# Patient Record
Sex: Female | Born: 1975 | Race: White | Hispanic: No | Marital: Married | Smoking: Never smoker
Health system: Southern US, Community
[De-identification: ages and names within clinical notes are randomized; demographics above are authoritative.]

---

## 1998-03-11 ENCOUNTER — Other Ambulatory Visit: Admission: RE | Admit: 1998-03-11 | Discharge: 1998-03-11 | Payer: Self-pay | Admitting: *Deleted

## 1998-07-24 ENCOUNTER — Encounter: Admission: RE | Admit: 1998-07-24 | Discharge: 1998-07-24 | Payer: Self-pay | Admitting: Family Medicine

## 1999-08-03 ENCOUNTER — Other Ambulatory Visit: Admission: RE | Admit: 1999-08-03 | Discharge: 1999-08-03 | Payer: Self-pay | Admitting: *Deleted

## 2000-08-09 ENCOUNTER — Other Ambulatory Visit: Admission: RE | Admit: 2000-08-09 | Discharge: 2000-08-09 | Payer: Self-pay | Admitting: *Deleted

## 2001-08-30 ENCOUNTER — Other Ambulatory Visit: Admission: RE | Admit: 2001-08-30 | Discharge: 2001-08-30 | Payer: Self-pay | Admitting: Obstetrics and Gynecology

## 2002-10-17 ENCOUNTER — Other Ambulatory Visit: Admission: RE | Admit: 2002-10-17 | Discharge: 2002-10-17 | Payer: Self-pay | Admitting: Obstetrics & Gynecology

## 2003-04-26 ENCOUNTER — Inpatient Hospital Stay (HOSPITAL_COMMUNITY): Admission: AD | Admit: 2003-04-26 | Discharge: 2003-04-26 | Payer: Self-pay | Admitting: Obstetrics & Gynecology

## 2003-05-25 ENCOUNTER — Inpatient Hospital Stay (HOSPITAL_COMMUNITY): Admission: AD | Admit: 2003-05-25 | Discharge: 2003-05-28 | Payer: Self-pay | Admitting: Obstetrics and Gynecology

## 2003-07-01 ENCOUNTER — Other Ambulatory Visit: Admission: RE | Admit: 2003-07-01 | Discharge: 2003-07-01 | Payer: Self-pay | Admitting: Obstetrics & Gynecology

## 2004-12-02 ENCOUNTER — Inpatient Hospital Stay (HOSPITAL_COMMUNITY): Admission: AD | Admit: 2004-12-02 | Discharge: 2004-12-02 | Payer: Self-pay | Admitting: Obstetrics and Gynecology

## 2004-12-10 ENCOUNTER — Inpatient Hospital Stay (HOSPITAL_COMMUNITY): Admission: AD | Admit: 2004-12-10 | Discharge: 2004-12-13 | Payer: Self-pay | Admitting: Obstetrics & Gynecology

## 2004-12-11 ENCOUNTER — Encounter (INDEPENDENT_AMBULATORY_CARE_PROVIDER_SITE_OTHER): Payer: Self-pay | Admitting: *Deleted

## 2005-11-10 ENCOUNTER — Ambulatory Visit (HOSPITAL_COMMUNITY): Admission: RE | Admit: 2005-11-10 | Discharge: 2005-11-10 | Payer: Self-pay | Admitting: Obstetrics and Gynecology

## 2006-04-13 ENCOUNTER — Encounter (INDEPENDENT_AMBULATORY_CARE_PROVIDER_SITE_OTHER): Payer: Self-pay | Admitting: *Deleted

## 2006-04-13 ENCOUNTER — Inpatient Hospital Stay (HOSPITAL_COMMUNITY): Admission: AD | Admit: 2006-04-13 | Discharge: 2006-04-16 | Payer: Self-pay | Admitting: Obstetrics and Gynecology

## 2006-07-27 IMAGING — US US FETAL BPP W/O NONSTRESS
1 series · 13 of 21 positions shown · non-contrast
Comparison: none

CLINICAL DATA: 28-year-old.  40 weeks pregnant.  Evaluate movement and fluid.

[Series 1: us fetal bpp w/o nonstress · 0.33mm/px · 13 of 21 slices shown]
[im 1/21]
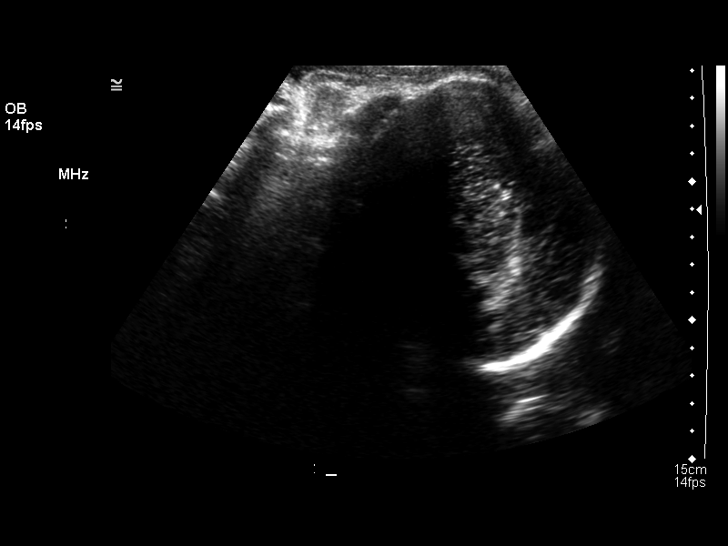
[im 3/21]
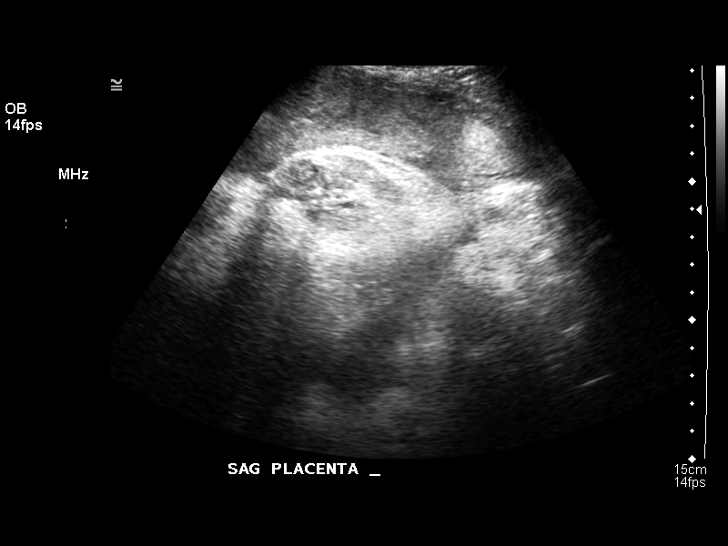
[im 5/21]
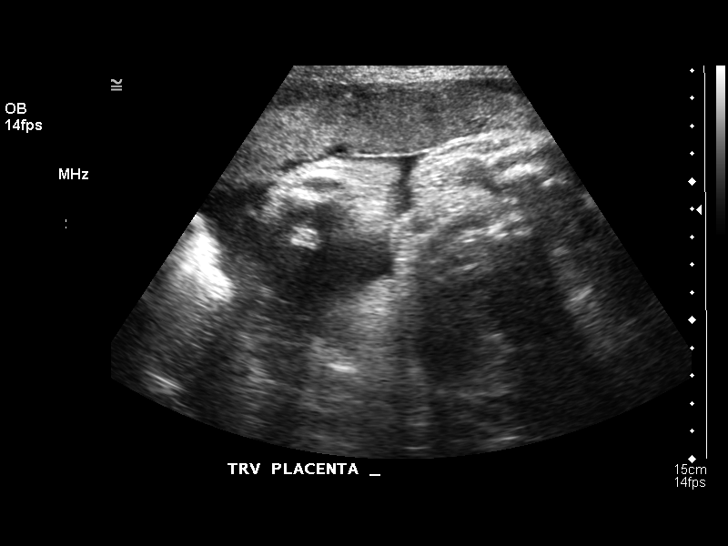
[im 6/21]
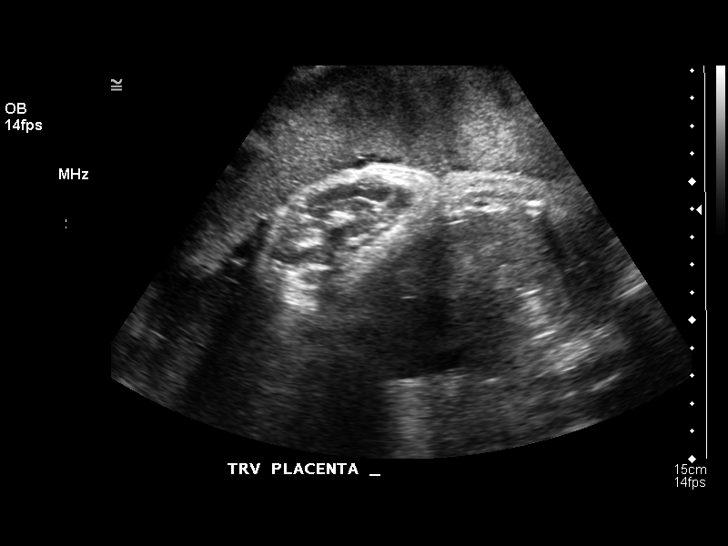
[im 8/21]
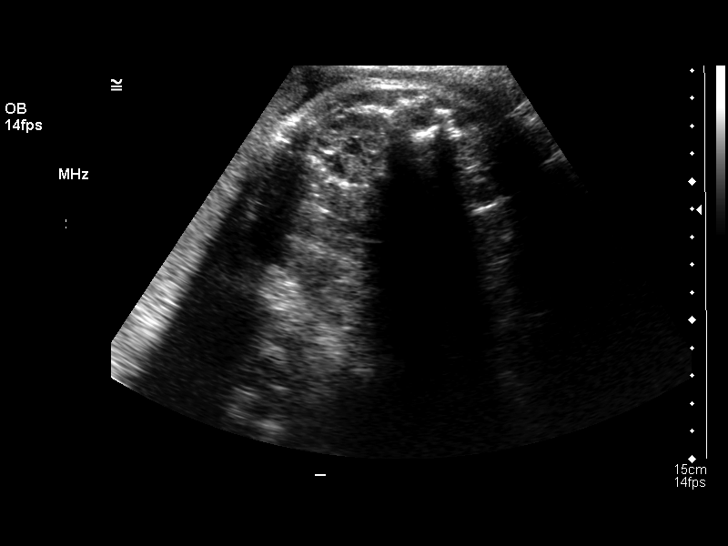
[im 9/21]
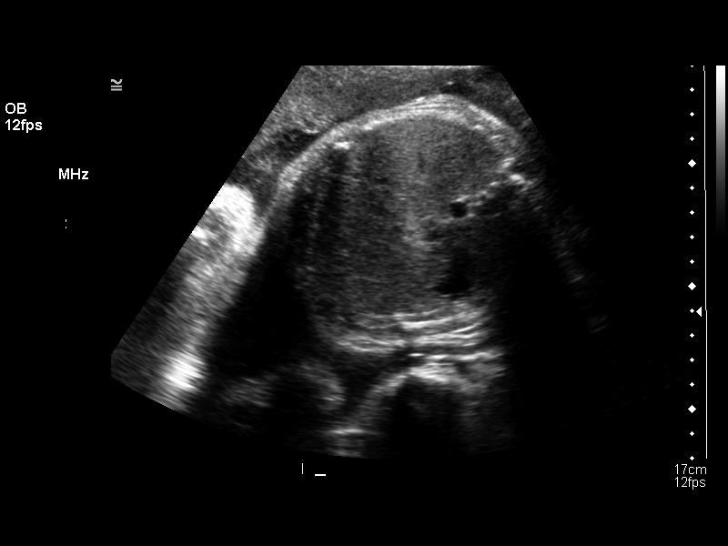
[im 11/21]
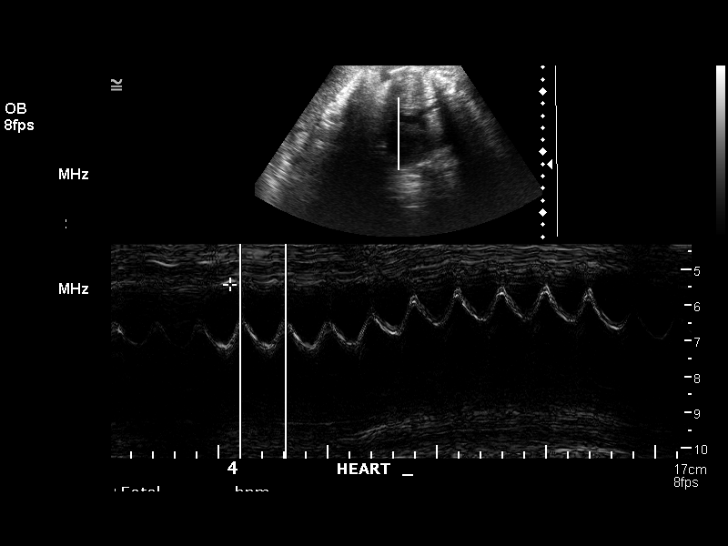
[im 13/21]
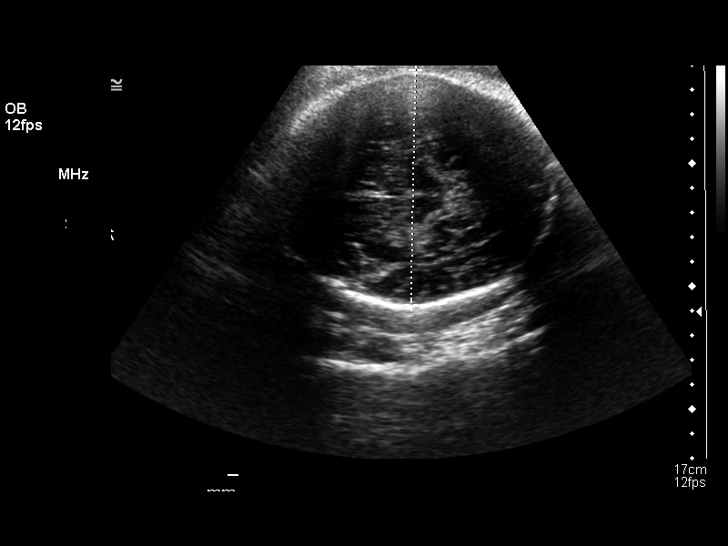
[im 14/21]
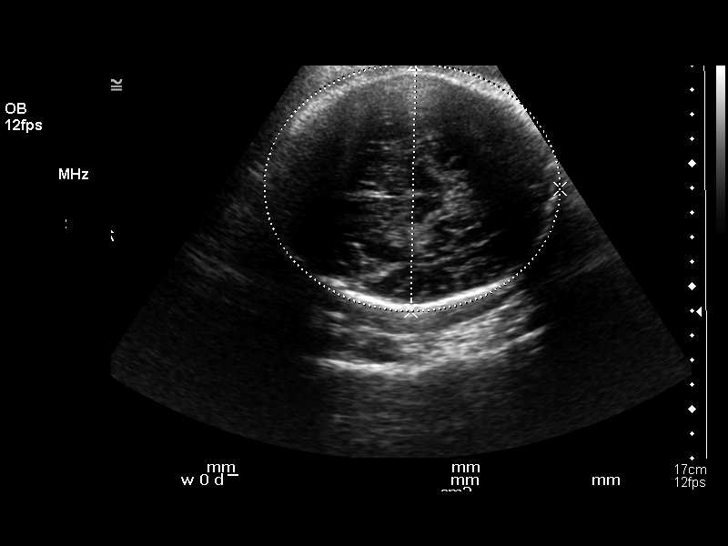
[im 16/21]
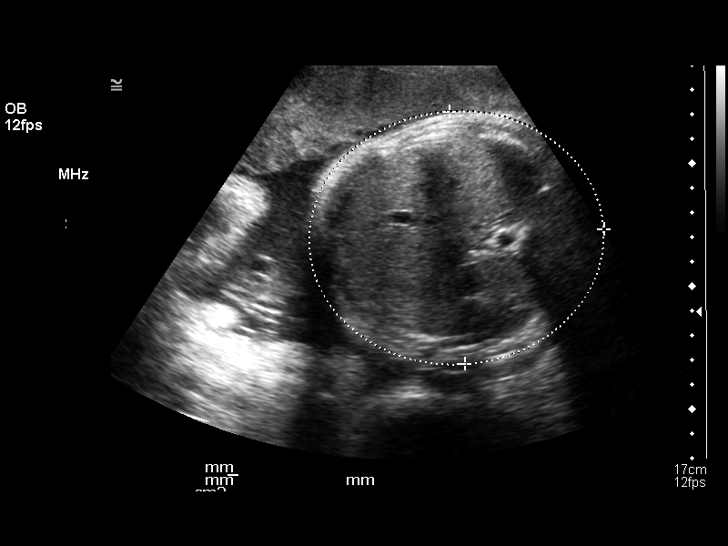
[im 17/21]
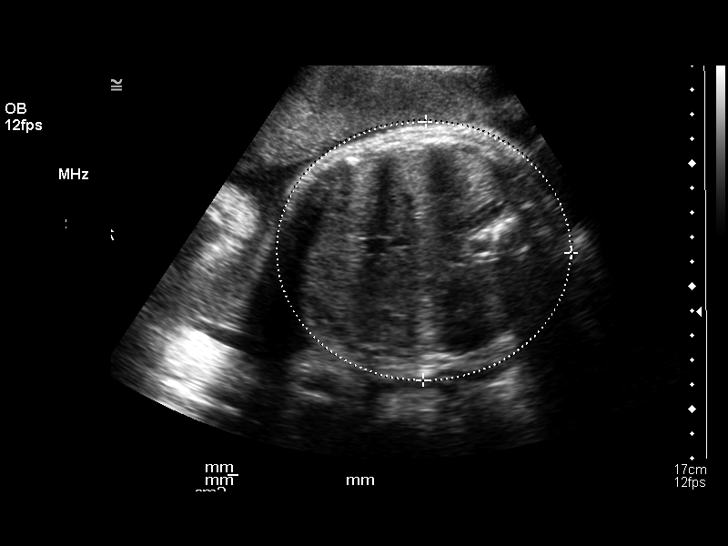
[im 19/21]
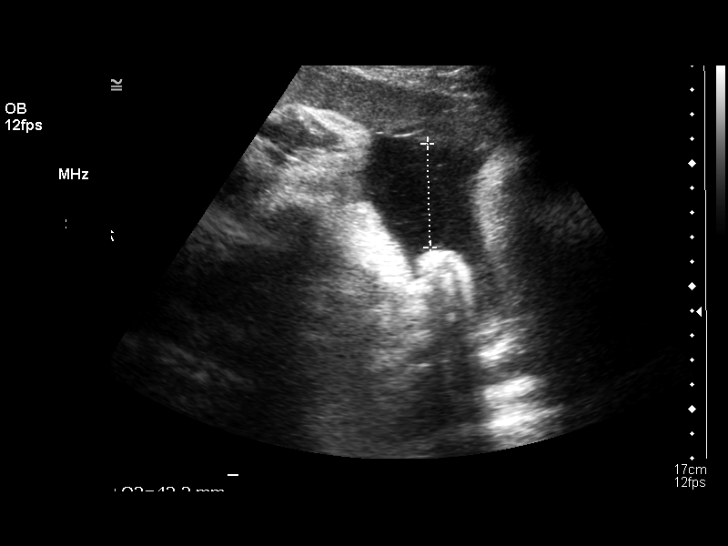
[im 21/21]
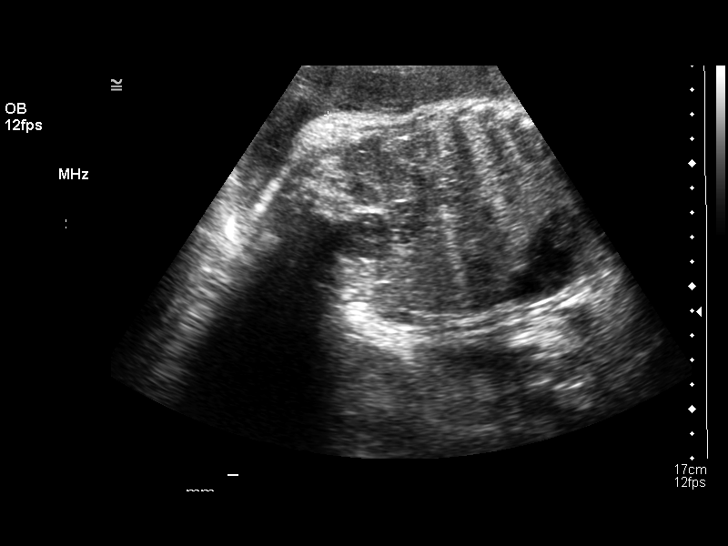

[13 of 21 positions shown; findings below may reference images not displayed]

OBSTETRICAL ULTRASOUND:
 Number of Fetuses:  1
 Heart Rate:  144
 Movement:  Yes
 Breathing:    Yes  
 Presentation:  Cephalic
 Placental Location:  Anterior
 Grade:  I
 Previa:  No
 Amniotic Fluid (Subjective):  Normal
 Amniotic Fluid (Objective):   11.8 cm AFI (5th -95th%ile = 7.1 ? 21.4 cm for 40 wks)

 FETAL BIOMETRY
 BPD:   9.6 cm   39 w 0 d
 HC:   34.3 cm  39 w 4 d
 AC:   35.1 cm   39 w 0 d
 FL:    7.0 cm   36 w 1 d

 MEAN GA:  38 w 3 d

 EFW:  3013 g (H) 25th ? 50th%ile (0011 ? 0767 g) For 40 wks

 FETAL ANATOMY
 Lateral Ventricles:    Visualized 
 Thalami/CSP:      Visualized 
 Posterior Fossa:  Not visualized 
 Nuchal Region:    N/A
 Spine:      Not visualized 
 4 Chamber Heart on Left:      Visualized 
 Stomach on Left:      Visualized 
 3 Vessel Cord:    Not visualized   
 Cord Insertion site:    Not visualized 
 Kidneys:  Visualized 
 Bladder:  Visualized 
 Extremities:      Not visualized 

 MATERNAL UTERINE AND ADNEXAL FINDINGS
 Cervix:   Not evaluated

 BIOPHYSICAL PROFILE

 Movement:  2    Time:  10 minutes
 Breathing:  2
 Tone:  2
 Amniotic Fluid:  2

 Total Score:  8
IMPRESSION: 1.  Single living intrauterine fetus in a cephalic presentation with normal amniotic fluid volume and an anterior grade 1 placenta.  Fetus is estimated at 39 weeks and 6 days gestation.
 2.  Biophysical profile score was [DATE] at 10 minutes.

## 2010-03-29 ENCOUNTER — Inpatient Hospital Stay (HOSPITAL_COMMUNITY): Admission: AD | Admit: 2010-03-29 | Discharge: 2010-03-31 | Payer: Self-pay | Admitting: Obstetrics and Gynecology

## 2010-10-08 LAB — CBC
MCH: 27.9 pg (ref 26.0–34.0)
MCHC: 33.3 g/dL (ref 30.0–36.0)
MCHC: 33.7 g/dL (ref 30.0–36.0)
MCV: 84.9 fL (ref 78.0–100.0)
Platelets: 223 10*3/uL (ref 150–400)
Platelets: 247 10*3/uL (ref 150–400)
RDW: 14.2 % (ref 11.5–15.5)
WBC: 11.8 10*3/uL — ABNORMAL HIGH (ref 4.0–10.5)

## 2010-10-08 LAB — RPR: RPR Ser Ql: NONREACTIVE

## 2010-12-11 NOTE — H&P (Signed)
NAMECARAL, WHAN NO.:  [id number]   MEDICAL RECORD NO.:  [id number]           PATIENT TYPE:   LOCATION:                                FACILITY:  [facility]   PHYSICIAN:  Richardean Sale, M.D.   DATE OF BIRTH:  [birth year]   DATE OF ADMISSION:  12/10/2004  DATE OF DISCHARGE:                                HISTORY & PHYSICAL   ADMITTING DIAGNOSIS:  Forty-one-week intrauterine pregnancy for induction of  labor.   HISTORY OF PRESENT ILLNESS:  This is a 35 year old gravida 2, para 1-0-0-1  white female with a due date of Dec 03, 2004 who presents today for  induction of labor secondary to postdates.  Prenatal care has been at  [facility] with Dr. Richardean Sale as the primary attending and  pregnancy has been uncomplicated.  Patient reports good fetal movement and  irregular contractions, denies any loss of fluid or vaginal bleeding.   PAST OBSTETRIC HISTORY:  Spontaneous vaginal delivery x1 at 40+ weeks of 7  pounds 7 ounces.   GYNECOLOGIC HISTORY:  No history of abnormal Pap smears.  Initial Chlamydia  screen positive this pregnancy, repeat negative after treatment.   PAST MEDICAL HISTORY:  No hospitalizations.   PAST SURGICAL HISTORY:  Wisdom teeth.   FAMILY HISTORY:  No known birth defects, congenital anomalies, Down  syndrome, spina bifida or cystic fibrosis.  Positive for prostate cancer in  patient's father and grandparents with emphysema and coronary artery  disease.   SOCIAL HISTORY:  She is married.  Denies tobacco, alcohol or drugs.   MEDICATIONS:  Prenatal vitamins.   ALLERGIES:  No known drug allergies.   PHYSICAL EXAMINATION:  VITAL SIGNS:  Blood pressure 112/74, weight 153  pounds, afebrile.  GENERAL:  She is a well-developed, well-nourished white female who is in no  acute distress.  NECK AND THYROID:  Within normal limits.  HEART:  Regular rate and rhythm.  LUNGS:  Clear to auscultation.  ABDOMEN:  Gravid, soft, nontender, fundal  height is 36.  EXTREMITIES:  Trace edema in the feet bilaterally.  No cyanosis or clubbing.  NEUROLOGIC EXAM:  Nonfocal.  CERVIX:  Cervix is 2-3 cm, 70%, -1 station, vertex.  Fetal heart tones 120s-  130s by Doppler.  Last ultrasound performed on Dec 02, 2004 revealed an  appropriately growing fetus measuring 3453 g on estimated fetal weight,  amniotic fluid index was normal at 11.8 cm.   PRENATAL LABORATORIES:  Blood type is A positive, antibody screen negative,  RPR nonreactive, rubella immune, hepatitis B surface antigen nonreactive,  HIV was declined.  Initial Chlamydia screen was positive, repeat was  negative after treatment.  Gonorrhea screen negative.  Pap smear within  normal limits.  Triple test within normal limits and group B beta strep was  negative.  One-hour glucola was 120.   ASSESSMENT:  [age]-year-old gravida 2, para 1-0-0-1 white female at [redacted]  weeks gestation for induction of labor secondary to postdates.   PLAN:  1.  We will admit to labor and delivery.  2.  Continuous fetal monitoring.  3.  We will start load of Pitocin and perform amniotomy.  4.  Anticipate attempts at vaginal delivery.      JW/MEDQ  D:  12/09/2004  T:  12/09/2004  Job:  [id number]

## 2010-12-11 NOTE — Discharge Summary (Signed)
NAMEJAYLINE, Mariah Andrade                  ACCOUNT NO.:  [id number]   MEDICAL RECORD NO.:  [id number]          PATIENT TYPE:  INP   LOCATION:  9142                          FACILITY:  [facility]   PHYSICIAN:  Lenoard Aden, M.D.DATE OF BIRTH:  10-06-1975   DATE OF ADMISSION:  04/13/2006  DATE OF DISCHARGE:  04/16/2006                                 DISCHARGE SUMMARY   The patient underwent an emergent C section April 13, 2006.  Postoperative course uncomplicated. Discharged to home on day 3. Discharge  teaching done. Prenatal vitamins, iron and Tylox given. Followup in the  office in 4-6 weeks.      Lenoard Aden, M.D.  Electronically Signed     RJT/MEDQ  D:  05/07/2006  T:  05/09/2006  Job:  [id number]

## 2010-12-11 NOTE — H&P (Signed)
NAME:  ABBEY, VEITH                            ACCOUNT NO.:  [id number]   MEDICAL RECORD NO.:  [id number]                   PATIENT TYPE:  INP   LOCATION:  9128                                 [facility]ACILITY:  [facility]   PHYSICIAN:  Richardean Sale, M.D.                DATE OF BIRTH:  1976/03/08   DATE OF ADMISSION:  05/25/2003  DATE OF DISCHARGE:                                HISTORY & PHYSICAL   ADMISSION DIAGNOSES:  A 35 year old gravida 1, para 0 white female at 40  weeks and 3 days gestation, in labor.   HISTORY OF PRESENT ILLNESS:  This is a 35 year old gravida 1, para 0 white  female with a due date of May 23, 2003, who presents at 64 and [redacted] weeks  gestation with complaints of contractions every 5 minutes that are moderate  in intensity. The patient denies any vaginal bleeding, any loss of fluid and  reports good fetal movement. The pregnancy has been uncomplicated. Last  cervical examination, she was 3 cm, 80% effaced and a minus 1 station. On  examination here, she is 4 to 5 cm,  90% effaced, 0 station, vertex with a  bag of water intact.   PAST GYNECOLOGIC HISTORY:  Menarche at age 56. Menses 28 days, lasting 2 to  4 days in length. No history of STD's. No history of abnormal pap smears.   PAST MEDICAL HISTORY:  None.   PAST SURGICAL HISTORY:  None.   SOCIAL HISTORY:  No tobacco, alcohol or drugs.   FAMILY HISTORY:  No known history of inherited genetic diseases, birth  defects, mental retardation or bleeding disorders.   MEDICATIONS:  Prenatal vitamins.   ALLERGIES:  None.   LABORATORY DATA:  Hemoglobin 13.1, hematocrit 38.1. Blood type A+. Antibody  screen negative. RPR non-reactive. Rubella immune. Hepatitis B surface  antigen negative. HIV non-reactive. Alpha fetoprotein triple test within  normal limits. Group B beta strep status unknown at this time. Per patient,  culture reported to be done at 35 weeks. 1 hour glucose tolerance test  within normal limits per the  patient's report but exact value not available  at this time.   PHYSICAL EXAMINATION:  VITAL SIGNS:  Blood pressure 115/79, heart rate 69,  temperature 98.2. Fetal heart rate tracing is in the 120's with positive  accelerations. Tocometer contractions every 5 minutes.  GENERAL:  A well developed, well nourished white female who appears in no  acute distress.  HEART:  Regular rate and rhythm.  CHEST:  Lungs are clear to auscultation bilaterally.  ABDOMEN:  Gravid and non-tender. Fundus palpates moderate with contractions.  PELVIC:  Cervix 4 to 5 cm, 90% effaced, 0 station, and vertex.  EXTREMITIES:  No edema. Non-tender.   ASSESSMENT:  A 35 year old gravida 1, para 0 white female at 40 weeks and 3  days gestation, in labor.   PLAN:  1. Will admit  to labor and delivery.  2. Check CPD, RPR, type and screen.  3. Will obtain group B beta strep culture results. If positive will treat     with Penicillin.  4. Will check cervix in 1 hour. If no cervical change, will perform     amniotomy.  5. Stadol or epidural p.r.n.  6. Expect SPD.  7. Fetal heart rate tracings reassuring.                                               Richardean Sale, M.D.    JW/MEDQ  D:  05/25/2003  T:  05/26/2003  Job:  [id number]

## 2010-12-11 NOTE — Op Note (Signed)
NAME:  Mariah Andrade, Mariah Andrade                            ACCOUNT NO.:  [id number]   MEDICAL RECORD NO.:  [id number]                   PATIENT TYPE:  INP   LOCATION:  9128                                 FACILITY:  [facility]   PHYSICIAN:  Richardean Sale, M.D.                DATE OF BIRTH:  05-Jun-1976   DATE OF PROCEDURE:  05/26/2003  DATE OF DISCHARGE:                                 OPERATIVE REPORT   PREOPERATIVE DIAGNOSIS:  Term intrauterine pregnancy in second stage of  labor with fetal bradycardia.   POSTOPERATIVE DIAGNOSIS:  Term intrauterine pregnancy in second stage of  labor with fetal bradycardia.   PROCEDURES:  Outlet vacuum-assisted delivery.   ANESTHESIA:  Epidural.   COMPLICATIONS:  None.   ESTIMATED BLOOD LOSS:  300 mL.   FINDINGS:  A viable female infant in left occiput anterior presentation with  Apgars of 8 and 9.  Intact placenta with three-vessel cord.   INDICATIONS:  This is a 35 year old, gravida 1, para 0, white female who  presented at 40-3/[redacted] weeks gestation in spontaneous labor.  The patient  progressed adequately in labor and reached complete dilatation at a +2  station at 6 a.m.  The heart rate tracing had been reassuring with baseline  in the 10s with accelerations noted.  She had one spontaneous deceleration  upon placement of the Foley catheter earlier in the course of her labor, but  there were no further decelerations noted up until the time she reached  complete dilation.  At that point, she began to have variable decelerations  down to the 80s.  There was good fetal scalp stimulation throughout her  second stage, which lasted only 30 minutes.  The patient was counseled on  the need for possible vacuum-assisted delivery for fetal bradycardia.  The  risks of the procedure were reviewed with the patient and informed consent  was obtained.  The heart rate tracing then returned to baseline with good  accelerations.  The infant was left occiput transverse and was  manually  rotated to left occiput anterior without any difficulty.  There was good  scalp stimulation noted during the rotation.  The variable decelerations  recurred with fetal bradycardia in the 60s.  Therefore, the kiwi vacuum was  used to facilitate delivery secondary to fetal bradycardia.   DESCRIPTION OF PROCEDURE:  The patient was prepped and draped in the  dorsolithotomy position.  The bladder was drained with a red rubber  catheter.  The fetal position was changed.  The infant was left occiput  anterior.  The kiwi vacuum was then placed over the flexion point 3 cm in  front of the posterior fontanel over the sagittal suture.  Vacuum was then  applied to 500 mmHg and the infant was delivered to crowning with the next  contraction.  The vacuum was then removed.  The vertex was delivered.  The  infant  was bulb suctioned on the perineum.  The shoulders were then  delivered within 10 seconds without difficulty and the infant was delivered  to the mother's abdomen with a vigorous cry.  The cord was clamped and cut.  Arterial cord gas was sent.  Cord blood was collected and the placenta was  then delivered spontaneously and intact with a three-vessel cord.  The  uterus was then explored manually.  There was no retained placenta.  The  perineum was inspected and there was a spontaneous partial third degree  midline laceration.  This was repaired with 3-0 Vicryl in a standard  fashion.  A rectal exam was performed which revealed no palpable sutures in  the rectum and the sphincter was intact.  The cervix was inspected and was  intact.  There were no vault lacerations noted.  Anesthesia was by epidural.  The estimated blood loss was 300 mL.  Apgars were 8 and 9.  The arterial  cord gas was 7.34.   The patient tolerated the procedure well.  All sponge, lap, needle, and  instrument counts were correct x 2.  She and the infant are doing well at  the time of this dictation.                                                Richardean Sale, M.D.    JW/MEDQ  D:  05/26/2003  T:  05/26/2003  Job:  [id number]

## 2010-12-11 NOTE — Op Note (Signed)
Mariah Andrade, Mariah Andrade                  ACCOUNT NO.:  [id number]   MEDICAL RECORD NO.:  [id number]          PATIENT TYPE:  INP   LOCATION:  9142                          FACILITY:  [facility]   PHYSICIAN:  Lenoard Aden, M.D.DATE OF BIRTH:  10/30/1975   DATE OF PROCEDURE:  04/13/2006  DATE OF DISCHARGE:                                 OPERATIVE REPORT   PREOPERATIVE DIAGNOSIS:  Nonreassuring fetal heart rate.   POSTOPERATIVE DIAGNOSIS:  Placental abruption.   OPERATION PERFORMED:  Emergent low segment transverse cesarean section.   SURGEON:  Lenoard Aden, M.D.   ASSISTANT:  Richardean Sale, M.D.   ANESTHESIA:  Spinal by Quillian Quince, M.D.   ESTIMATED BLOOD LOSS:  1500 mL.   COMPLICATIONS:  None.   DRAINS:  Foley catheter.   COUNTS:  Correct.   Patient to recovery in good condition.   DESCRIPTION OF PROCEDURE:  After being apprised of the risks of anesthesia,  infection and bleeding, the patient was brought urgently to the operating  room where she was administered spinal anesthetic without complication,  prepped and draped in the usual sterile fashion.  Foley catheter was placed.  Pfannenstiel skin incision made with a scalpel, carried down to fascia which  was opened bluntly.  Peritoneum entered bluntly.  Bladder blade placed.  Visceral peritoneum is scored in a smiley fashion, dissected sharply over  the lower uterine segment.  Curved hysterotomy incision made.  Clear  amniotic fluid.  Atraumatic delivery full term living female.  Pediatricians  in attendance.  Apgars 6 and 9, cord pH 7.02, large concealed placental  abruption is noted upon delivery of the placenta which probably about a 70  to 80% is noted.  Uterus exteriorized, curetted using a dry lap pack and  closed in two running imbricating layers using a 0 Monocryl suture.  An  O'Leary stitch is placed at the left lateral margin to obtain hemostasis.  No hematomas were formed.  Bladder flap was inspected and  found to be  hemostatic.  Irrigation accomplished.  All blood clots removed.  Peritoneum  closed in a running fashion using 2-0 Vicryl in continuous  running fashion.  Skin closed using staples after closure of the fascia with  a 0 Monocryl in continuous running fashion.  Then the skin was closed with  staples.  The patient tolerated the procedure well and was transferred to  recovery in stable condition.      Lenoard Aden, M.D.  Electronically Signed     RJT/MEDQ  D:  04/13/2006  T:  04/14/2006  Job:  [id number]

## 2018-03-13 ENCOUNTER — Other Ambulatory Visit: Payer: Self-pay | Admitting: Obstetrics

## 2018-03-13 DIAGNOSIS — N6320 Unspecified lump in the left breast, unspecified quadrant: Secondary | ICD-10-CM

## 2018-03-17 ENCOUNTER — Ambulatory Visit
Admission: RE | Admit: 2018-03-17 | Discharge: 2018-03-17 | Disposition: A | Discharge status: Home / Self Care | Payer: BC Managed Care – PPO | Source: Ambulatory Visit | Attending: Obstetrics | Admitting: Obstetrics

## 2018-03-17 DIAGNOSIS — N6320 Unspecified lump in the left breast, unspecified quadrant: Secondary | ICD-10-CM

## 2019-07-10 ENCOUNTER — Telehealth: Payer: Self-pay | Admitting: Oncology

## 2019-07-10 NOTE — Telephone Encounter (Signed)
A new high risk appt has been scheduled for [name] to see Dr. Jana Hakim on 1/11 at 4pm. Pt has been cld and made aware to arrive 15 minutes early.

## 2019-08-05 ENCOUNTER — Encounter: Payer: Self-pay | Admitting: Oncology

## 2019-08-05 NOTE — Progress Notes (Signed)
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 Her mother was diagnosed with ovarian cancer at age 25. Per patient, the cancer began in her mother's fallopian tubes. Her father has a history of prostate cancer. She underwent [facility] genetic testing on 06/14/2019 as detailed below. 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  The patient's subsequent history is as detailed below.   INTERVAL HISTORY: Mariah Andrade was evaluated in the high risk cancer clinic on 08/06/2019.   REVIEW OF SYSTEMS: Mariah Andrade denies unusual headaches, visual changes, nausea, vomiting, stiff neck, dizziness, or gait imbalance. There has been no cough, phlegm production, or pleurisy, no chest pain or pressure, and no change in bowel or bladder habits. The patient denies fever, rash, bleeding, unexplained fatigue or unexplained weight loss. A detailed review of systems was otherwise entirely negative.   PAST MEDICAL HISTORY: No past medical history on file.  PAST SURGICAL HISTORY: Past Surgical History:  Procedure Laterality Date  . CESAREAN SECTION      FAMILY HISTORY: Family History  Problem Relation Age of Onset  . Ovarian cancer Mother   . Prostate cancer Father    The patient's father is 65 years old as of January 2021.  He developed prostate cancer in his 28s.  There is no other history of cancer in his side of the family that the patient is aware of.  The patient's mother is 45 years old as of January 2021.  She was diagnosed with ovarian cancer at age 43.  Her mother died from  Parkinson's disease and her father from complications of tobacco use.  1 sister, the patient's maternal aunt had a hysterectomy at a young age but the reason for that surgery is not known.  There is no other cancer history in the family to the patient's knowledge  GYNECOLOGIC HISTORY:  No LMP recorded. Menarche: 44 years old Age at first live birth: 44 years old [place] P 4 LMP 2011 (IUD placed) Contraceptive: IUD in place [place], replaced 06/04/2015) HRT n/a  Hysterectomy? no BSO? 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  Her husband Mariah Andrade is a Government social research officer in his own Engineer, materials) business she lives at home with Mariah Andrade, Mariah Andrade, and Mariah Andrade ages 16, 14, 13, and 52 as of January 2021.  The patient is a Psychologist, forensic    ADVANCED DIRECTIVES: In the absence of any documentation to the contrary, the patient's spouse is their HCPOA.    HEALTH MAINTENANCE: Social History   Tobacco Use  . Smoking status: Never Smoker  Substance Use Topics  . Alcohol use: Not on file  . Drug use: Not on file     Colonoscopy: n/a (age)  PAP: 02/2018, negative  Bone density:    Not on File  Current Outpatient Medications  Medication Sig Dispense Refill  . tamoxifen (NOLVADEX) 20 MG tablet Take 1 tablet (20 mg total) by mouth daily. 90 tablet 12   No current facility-administered medications for this visit.    OBJECTIVE: Young white woman in no acute distress  Vitals:   08/06/19 1555  BP: (!) 123/52  Pulse: 79  Resp: 18  Temp: 98.6 F (37 C)  SpO2: 100%     There is no height or weight on file to calculate BMI.   Wt Readings from Last 3 Encounters:  08/06/19 131 lb 4.8 oz (59.6 kg)      ECOG FS:0 - Asymptomatic  Ocular: Sclerae unicteric, pupils round and equal Ear-nose-throat: Wearing a mask Lymphatic: No cervical or supraclavicular adenopathy Lungs no rales or rhonchi Heart regular rate and rhythm Abd soft,  nontender, positive bowel sounds MSK no focal spinal tenderness, no joint edema Neuro: non-focal, well-oriented, appropriate affect Breasts: Normally lumpy for age.  No suspicious findings.  Both axillae are benign.   LAB RESULTS:  CMP  No results found for: NA, K, CL, CO2, GLUCOSE, BUN, CREATININE, CALCIUM, PROT, ALBUMIN, AST, ALT, ALKPHOS, BILITOT, GFRNONAA, GFRAA  No results found for: TOTALPROTELP, ALBUMINELP, A1GS, A2GS, BETS, BETA2SER, GAMS, MSPIKE, SPEI  Lab Results  Component Value Date   WBC 11.8 (H) 03/30/2010   HGB 7.8 DELTA CHECK NOTED REPEATED TO VERIFY (L) 03/30/2010   HCT 23.0 (L) 03/30/2010   MCV 84.9 03/30/2010   PLT 223 03/30/2010    No results found for: LABCA2  No components found for: EHUDJS970  No results for input(s): INR in the last 168 hours.  No results found for: LABCA2  No results found for: YOV785  No results found for: YIF027  No results found for: XAJ287  No results found for: CA2729  No components found for: HGQUANT  No results found for: CEA1 / No results found for: CEA1   No results found for: AFPTUMOR  No results found for: CHROMOGRNA  No results found for: KPAFRELGTCHN, LAMBDASER, KAPLAMBRATIO (kappa/lambda light chains)  No results found for: HGBA, HGBA2QUANT, HGBFQUANT, HGBSQUAN (Hemoglobinopathy evaluation)   No results found for: LDH  No results found for: IRON, TIBC, IRONPCTSAT (Iron and TIBC)  No results found for: FERRITIN  Urinalysis No results found for: COLORURINE, APPEARANCEUR, LABSPEC, PHURINE, GLUCOSEU, HGBUR, BILIRUBINUR, KETONESUR, PROTEINUR, UROBILINOGEN, NITRITE, LEUKOCYTESUR   STUDIES: Mammography 03/17/2018 describes the breast density is category C.  Mammography 06/14/2019 describes it as D  ELIGIBLE FOR AVAILABLE RESEARCH PROTOCOL: no  ASSESSMENT: 44 y.o. 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  The RAD51c Romie Minus is involved in homologous recombination and repair of DNA.  It can be associated with Fanconi anemia-like syndrome.  It localizes to chromosome 17 Q 23 and amplification frequently occurs in breast tumors.  It is associated with increased risk of ovarian cancer.  A recent article [Journal of Ovarian Research volume 13, Article number: 50 (2020)] estimates and odds ratio 4.94 meaning that a patient  with this gene would have 5 times the normal risk of developing an ovarian malignancy.  There appears to be some increased risk of breast cancer as well but this cannot be quantitated with her current data.  [place] understands that everyone has mutations in some genes although we are not at the point where we check all 23,000 genes routinely.  The advantage of actually knowing one has a mutation is that that information allows one to make choices.  We discussed the risk of ovarian cancer and the likely somewhat increased risk of breast cancer in detail as well.  Since the lifetime risk of developing ovarian cancer in the general population is a little under 2%, we used a 10% risk of developing ovarian cancer in the setting for the purposes of discussion.  This does not sound  so bad and it really is not, but the problem is that we have no way of effectively screening for ovarian cancer.  By the time we find it it likely will be stage III.  Adda has a good understanding of this is this is exactly what her mother is going through.  Accordingly I would favor bilateral salpingo-oophorectomy with or without hysterectomy (she understands that there is a small amount of fallopian tube embedded in the uterus which if not removed would leave a small residual risk).  The question is when to do this since there are definite disadvantages to early menopause and we discussed that.  After much discussion it seemed best to wait a little and plan on definitive surgery for ovarian cancer prevention no later than age 55.  We then discussed the breast cancer risk issue and risk reduction strategies.  We considered intensified screening but I cannot show definitively that she has a greater than 20% lifetime breast cancer risk.  We could do a form of intensified screening with fast MRI instead of routine MRI but even that would represent approximately $500 every year.  A second risk reduction strategy is antiestrogens.  In her case this would mean tamoxifen and today we discussed the possible toxicities side effects and complications of this agent including concerns regarding endometrial carcinoma and blood clots.  I find that premenopausal patients who are on a levonorgestrel IUD do remarkably well on tamoxifen, with few if any side effects.  In more than 25 years in oncology I have only had one premenopausal patient on tamoxifen develop cancer of the uterus and that patient had a very strong family history for multiple cancers.  Note also that Ailana took oral contraceptives for many years with no clotting complications  At this point what she would like to do is give tamoxifen a try.  If she can tolerated well without significant complications the plan would be to continue that for total of 5 years  and not proceed to intensified screening.  Incidentally I would expect that her breasts, whether density C or D (the density has been variously classified), will be less dense at the end of 5 years of antiestrogens  I have gone ahead and made her a return appointment here for April but if everything is going just perfect at that time she will cancel the appointment and reschedule for a year from now  [place] has a good understanding of the overall plan. She agrees with it. She knows the goal of treatment in her case is prevention. She will call with any problems that may develop before her next visit here.  Total encounter time 60 minutes*  Chauncey Cruel, MD   08/06/2019 5:20 PM Medical Oncology and Hematology [facility] [place], [place] [address] Tel. [phone]    Fax. [phone]   This document serves as a record of services personally performed by Lurline Del, MD. It was created on his behalf by Wilburn Mylar, a trained medical scribe. The creation of this record is based on the scribe's personal observations and the provider's statements to them.   I, Lurline Del MD, have reviewed the above documentation for accuracy and completeness, and I agree with the above.  *Total Encounter Time as defined by the Centers for Medicare and Medicaid Services includes, in addition to the face-to-face time of a patient visit (documented in the note above) non-face-to-face time: obtaining and reviewing outside history, ordering and reviewing medications, tests or procedures, care coordination (communications with other health care professionals or caregivers) and documentation in the medical record.

## 2019-08-06 ENCOUNTER — Other Ambulatory Visit: Payer: Self-pay

## 2019-08-06 ENCOUNTER — Inpatient Hospital Stay: Payer: BC Managed Care – PPO | Attending: Oncology | Admitting: Oncology

## 2019-08-06 DIAGNOSIS — Z9189 Other specified personal risk factors, not elsewhere classified: Secondary | ICD-10-CM | POA: Insufficient documentation

## 2019-08-06 DIAGNOSIS — Z8041 Family history of malignant neoplasm of ovary: Secondary | ICD-10-CM | POA: Insufficient documentation

## 2019-08-06 MED ORDER — TAMOXIFEN CITRATE 20 MG PO TABS: 20.0000 mg | ORAL_TABLET | Freq: Every day | ORAL | 12 refills | Status: AC

## 2019-08-07 ENCOUNTER — Telehealth: Payer: Self-pay | Admitting: Oncology

## 2019-08-07 NOTE — Telephone Encounter (Signed)
I left a message regarding schedule  

## 2019-11-09 IMAGING — [facility] DIGITAL DIAGNOSTIC UNILATERAL LEFT MAMMOGRAM WITH TOMO AND CAD
4 series · 4 of 12 positions shown · non-contrast
Comparison: Previous exam(s).

CLINICAL DATA: The patient was called back for a left breast mass.

EXAM:
DIGITAL DIAGNOSTIC LEFT MAMMOGRAM WITH TOMO
ULTRASOUND LEFT BREAST

[L MLO synth-2D]
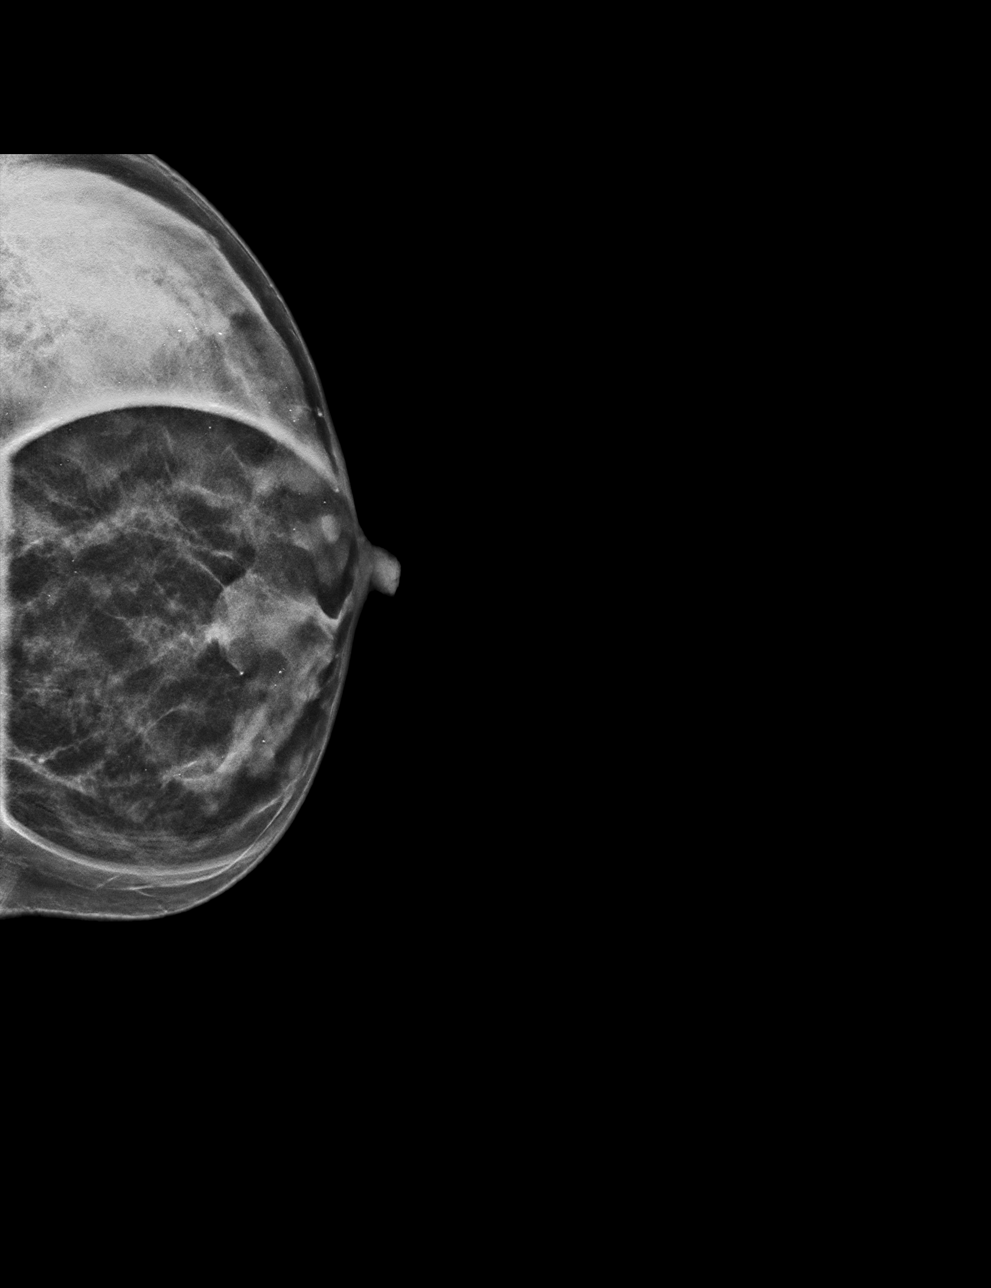

[L CC synth-2D]
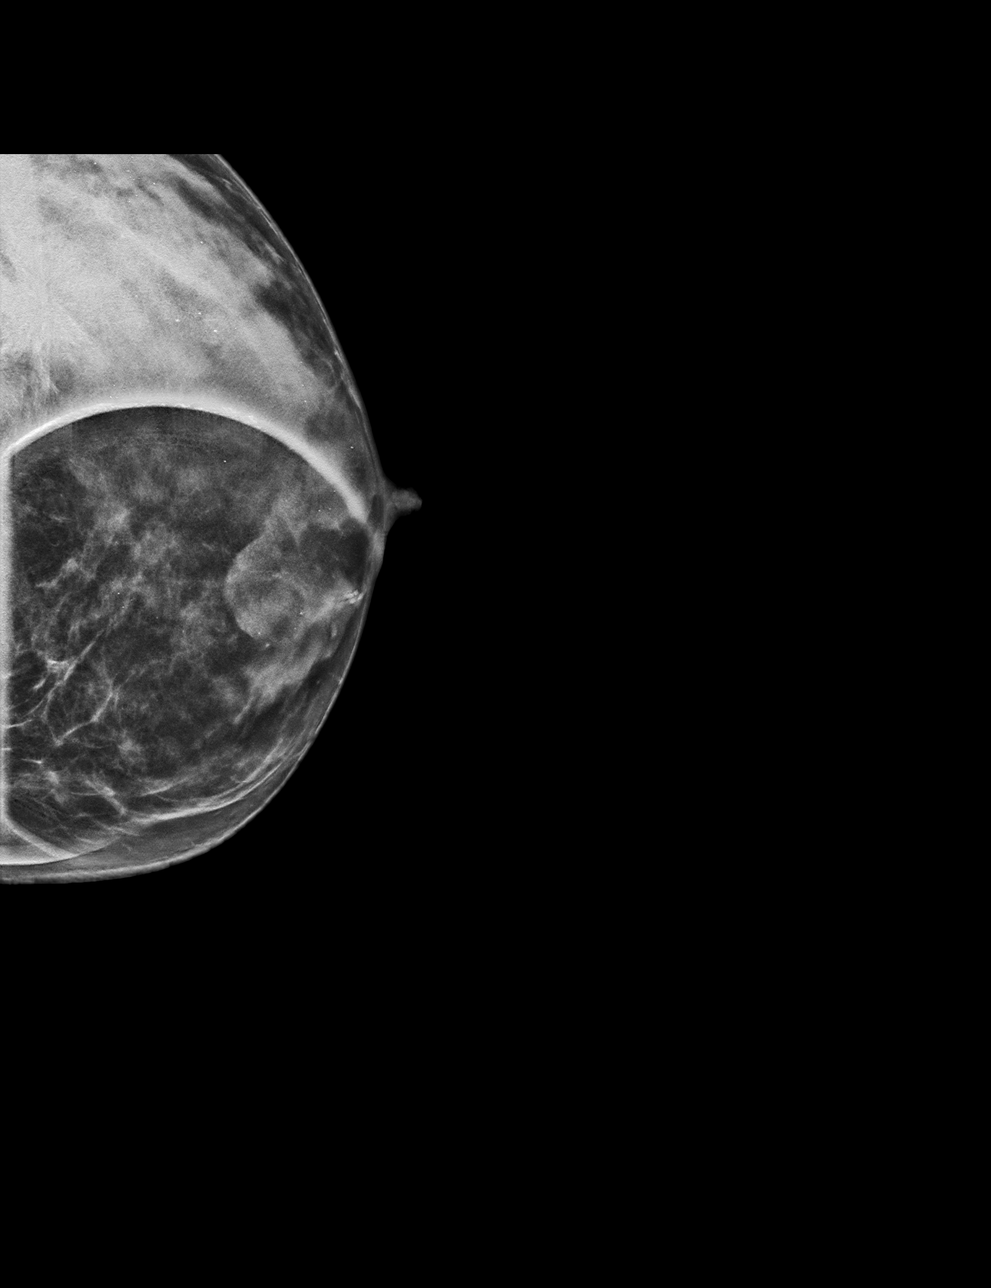

[L MLO tomo · tomo slice 21/41.0]
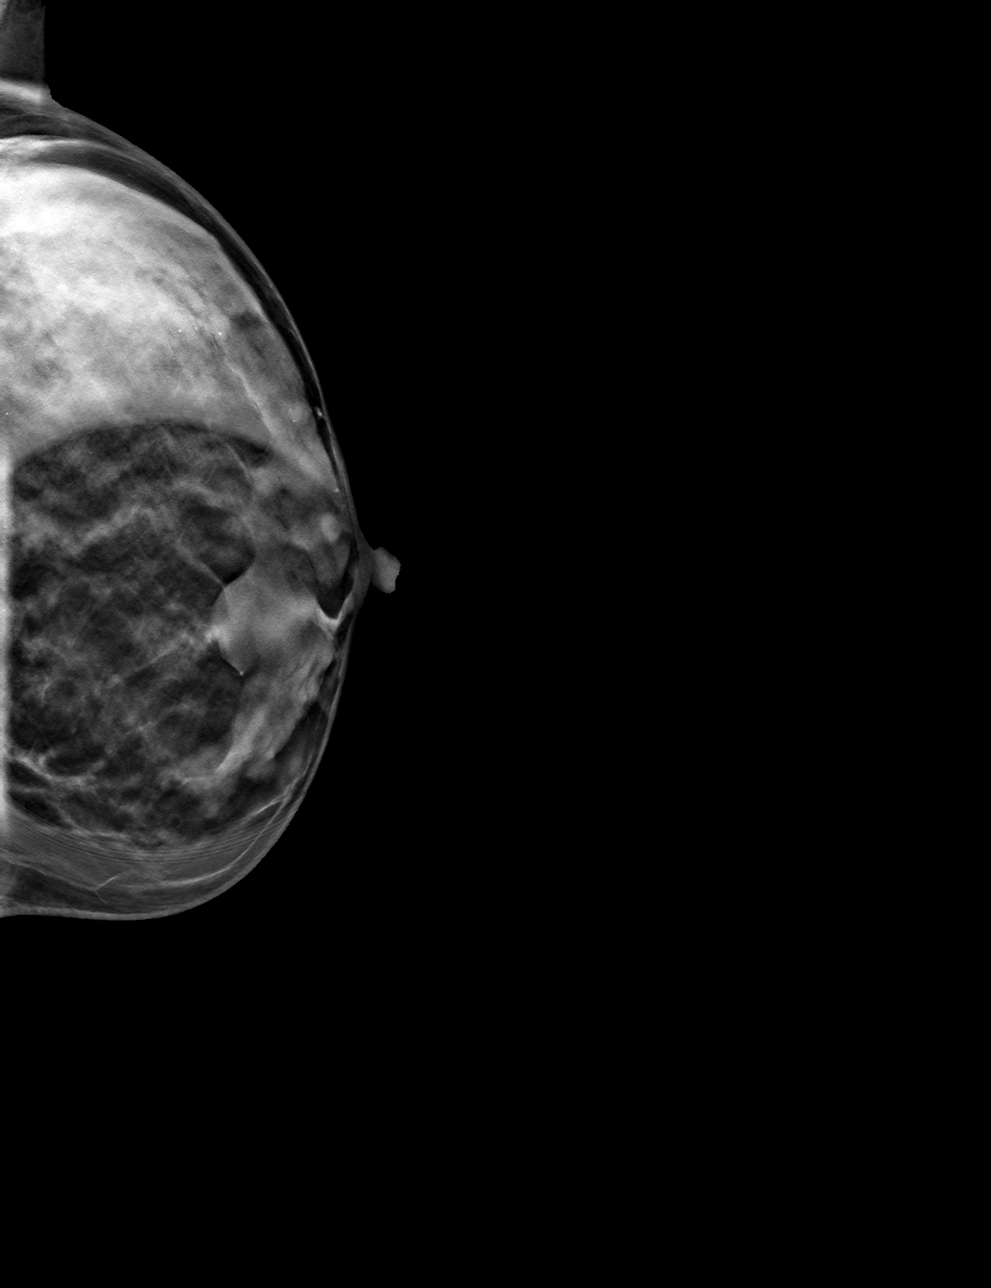

[L CC tomo · tomo slice 23/45.0]
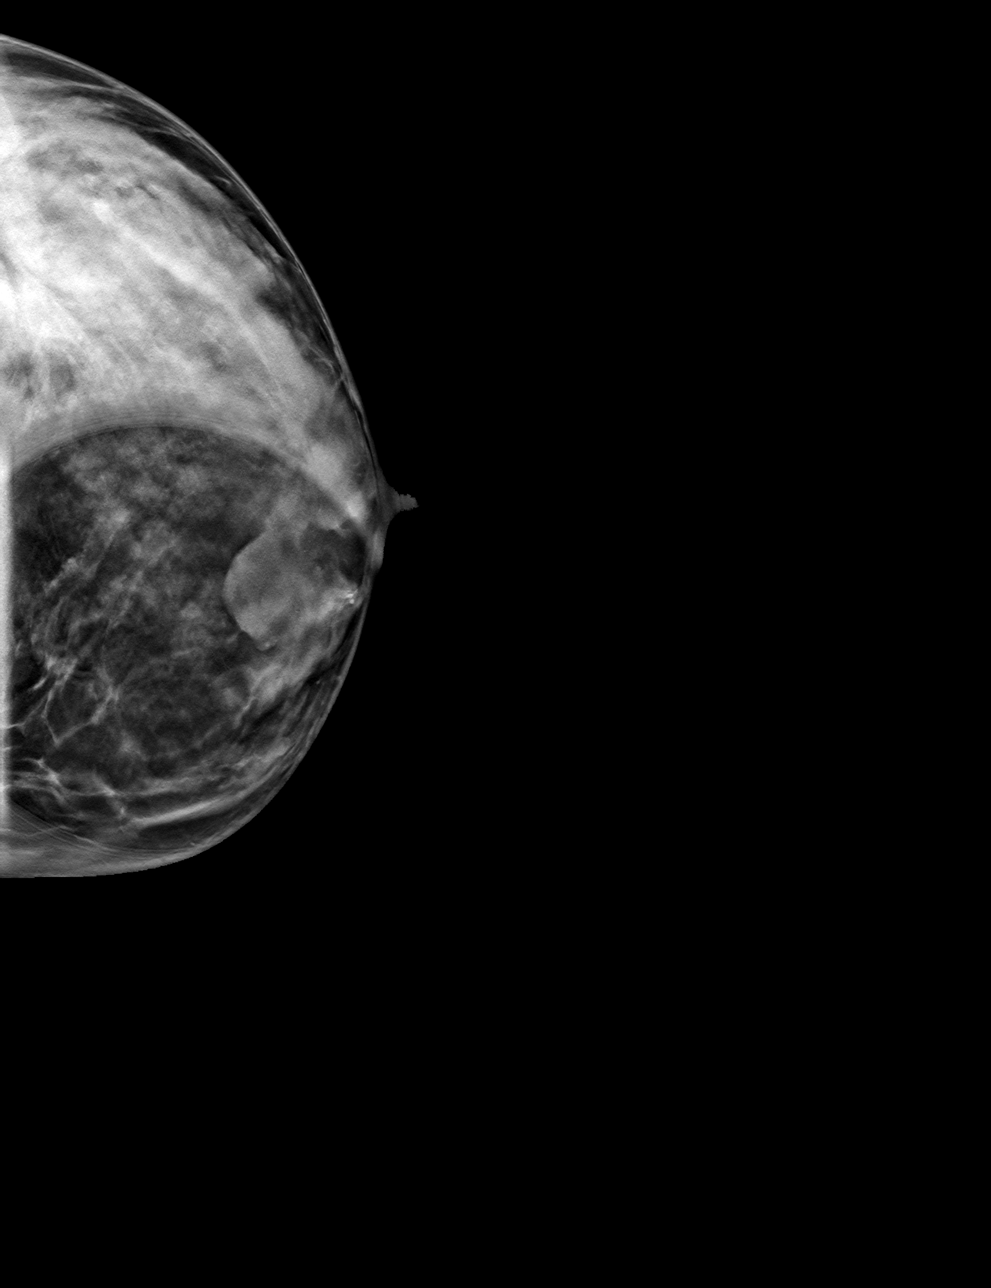

[4 of 12 positions shown; findings below may reference images not displayed]

ACR Breast Density Category c: The breast tissue is heterogeneously
dense, which may obscure small masses.
FINDINGS: The mass in the medial inferior retroareolar region persists on
additional imaging.

On physical exam, no suspicious lumps are identified.

Targeted ultrasound is performed, showing a simple cyst correlating
with the mammographic findings.
IMPRESSION: Fibrocystic changes.  No evidence of malignancy.

RECOMMENDATION:
Annual screening mammography.

I have discussed the findings and recommendations with the patient.
Results were also provided in writing at the conclusion of the
visit. If applicable, a reminder letter will be sent to the patient
regarding the next appointment.

BI-RADS CATEGORY  2: Benign.

## 2019-11-11 NOTE — Progress Notes (Signed)
No show

## 2019-11-12 ENCOUNTER — Inpatient Hospital Stay: Payer: BC Managed Care – PPO | Attending: Oncology | Admitting: Oncology

## 2019-11-12 ENCOUNTER — Encounter: Payer: Self-pay | Admitting: Oncology

## 2019-11-12 DIAGNOSIS — Z9189 Other specified personal risk factors, not elsewhere classified: Secondary | ICD-10-CM

## 2023-05-23 ENCOUNTER — Other Ambulatory Visit: Payer: Self-pay | Admitting: Obstetrics

## 2023-05-23 DIAGNOSIS — Z1509 Genetic susceptibility to other malignant neoplasm: Secondary | ICD-10-CM

## 2023-06-16 ENCOUNTER — Ambulatory Visit (HOSPITAL_BASED_OUTPATIENT_CLINIC_OR_DEPARTMENT_OTHER): Admit: 2023-06-16 | Payer: BC Managed Care – PPO | Admitting: Obstetrics

## 2023-06-16 ENCOUNTER — Encounter (HOSPITAL_BASED_OUTPATIENT_CLINIC_OR_DEPARTMENT_OTHER): Payer: Self-pay

## 2023-06-16 SURGERY — HYSTERECTOMY, TOTAL, ROBOT-ASSISTED, LAPAROSCOPIC, WITH BILATERAL SALPINGO-OOPHORECTOMY
Anesthesia: General | Laterality: Bilateral

## 2023-07-22 ENCOUNTER — Ambulatory Visit
Admission: RE | Admit: 2023-07-22 | Discharge: 2023-07-22 | Disposition: A | Discharge status: Home / Self Care | Payer: BC Managed Care – PPO | Source: Ambulatory Visit | Attending: Obstetrics | Admitting: Obstetrics

## 2023-07-22 DIAGNOSIS — Z1509 Genetic susceptibility to other malignant neoplasm: Secondary | ICD-10-CM

## 2023-07-29 ENCOUNTER — Other Ambulatory Visit: Payer: Self-pay | Admitting: Obstetrics

## 2023-07-29 DIAGNOSIS — R928 Other abnormal and inconclusive findings on diagnostic imaging of breast: Secondary | ICD-10-CM

## 2023-08-12 ENCOUNTER — Ambulatory Visit
Admission: RE | Admit: 2023-08-12 | Discharge: 2023-08-12 | Disposition: A | Discharge status: Home / Self Care | Payer: 59 | Source: Ambulatory Visit | Attending: Obstetrics | Admitting: Obstetrics

## 2023-08-12 ENCOUNTER — Other Ambulatory Visit: Payer: Self-pay | Admitting: Obstetrics

## 2023-08-12 ENCOUNTER — Ambulatory Visit
Admission: RE | Admit: 2023-08-12 | Discharge: 2023-08-12 | Disposition: A | Discharge status: Home / Self Care | Payer: Self-pay | Source: Ambulatory Visit | Attending: Obstetrics | Admitting: Obstetrics

## 2023-08-12 DIAGNOSIS — N6310 Unspecified lump in the right breast, unspecified quadrant: Secondary | ICD-10-CM

## 2023-08-12 DIAGNOSIS — R928 Other abnormal and inconclusive findings on diagnostic imaging of breast: Secondary | ICD-10-CM

## 2024-02-24 ENCOUNTER — Ambulatory Visit
Admission: RE | Admit: 2024-02-24 | Discharge: 2024-02-24 | Disposition: A | Discharge status: Home / Self Care | Source: Ambulatory Visit | Attending: Obstetrics | Admitting: Obstetrics

## 2024-02-24 DIAGNOSIS — N6310 Unspecified lump in the right breast, unspecified quadrant: Secondary | ICD-10-CM

## 2025-01-10 ENCOUNTER — Ambulatory Visit (HOSPITAL_COMMUNITY): Admit: 2025-01-10 | Admitting: Obstetrics
